# Patient Record
Sex: Female | Born: 1990 | Race: Black or African American | Hispanic: No | Marital: Single | Smoking: Never smoker
Health system: Southern US, Community
[De-identification: ages and names within clinical notes are randomized; demographics above are authoritative.]

## PROBLEM LIST (undated history)

## (undated) DIAGNOSIS — D649 Anemia, unspecified: Secondary | ICD-10-CM

---

## 2015-03-23 ENCOUNTER — Encounter (HOSPITAL_COMMUNITY)
Admission: EM | Disposition: A | Discharge status: Home / Self Care | Payer: Commercial Managed Care - PPO | Source: Home / Self Care | Attending: Neurosurgery

## 2015-03-23 ENCOUNTER — Inpatient Hospital Stay (HOSPITAL_COMMUNITY)
Admission: EM | Admit: 2015-03-23 | Discharge: 2015-03-27 | DRG: 456 | Disposition: A | Discharge status: Home / Self Care | Payer: Commercial Managed Care - PPO | Attending: Neurosurgery | Admitting: Neurosurgery

## 2015-03-23 ENCOUNTER — Encounter (HOSPITAL_COMMUNITY): Payer: Self-pay | Admitting: Emergency Medicine

## 2015-03-23 ENCOUNTER — Inpatient Hospital Stay (HOSPITAL_COMMUNITY): Payer: Commercial Managed Care - PPO | Admitting: Certified Registered"

## 2015-03-23 ENCOUNTER — Emergency Department (HOSPITAL_COMMUNITY): Payer: Commercial Managed Care - PPO

## 2015-03-23 ENCOUNTER — Inpatient Hospital Stay (HOSPITAL_COMMUNITY): Payer: Commercial Managed Care - PPO

## 2015-03-23 DIAGNOSIS — Y92410 Unspecified street and highway as the place of occurrence of the external cause: Secondary | ICD-10-CM

## 2015-03-23 DIAGNOSIS — S32001A Stable burst fracture of unspecified lumbar vertebra, initial encounter for closed fracture: Secondary | ICD-10-CM | POA: Diagnosis present

## 2015-03-23 DIAGNOSIS — Z419 Encounter for procedure for purposes other than remedying health state, unspecified: Secondary | ICD-10-CM

## 2015-03-23 DIAGNOSIS — S34101A Unspecified injury to L1 level of lumbar spinal cord, initial encounter: Secondary | ICD-10-CM | POA: Diagnosis present

## 2015-03-23 DIAGNOSIS — S32019A Unspecified fracture of first lumbar vertebra, initial encounter for closed fracture: Secondary | ICD-10-CM

## 2015-03-23 DIAGNOSIS — M549 Dorsalgia, unspecified: Secondary | ICD-10-CM | POA: Diagnosis present

## 2015-03-23 DIAGNOSIS — S32011A Stable burst fracture of first lumbar vertebra, initial encounter for closed fracture: Principal | ICD-10-CM | POA: Diagnosis present

## 2015-03-23 DIAGNOSIS — S32000A Wedge compression fracture of unspecified lumbar vertebra, initial encounter for closed fracture: Secondary | ICD-10-CM

## 2015-03-23 HISTORY — PX: LUMBAR LAMINECTOMY: SHX95

## 2015-03-23 HISTORY — DX: Anemia, unspecified: D64.9

## 2015-03-23 LAB — CBC WITH DIFFERENTIAL/PLATELET
Basophils Absolute: 0 10*3/uL (ref 0.0–0.1)
Basophils Relative: 0 %
EOS PCT: 0 %
Eosinophils Absolute: 0 10*3/uL (ref 0.0–0.7)
HEMATOCRIT: 37.2 % (ref 36.0–46.0)
Hemoglobin: 11.6 g/dL — ABNORMAL LOW (ref 12.0–15.0)
LYMPHS ABS: 1.8 10*3/uL (ref 0.7–4.0)
LYMPHS PCT: 27 %
MCH: 24.5 pg — AB (ref 26.0–34.0)
MCHC: 31.2 g/dL (ref 30.0–36.0)
MCV: 78.5 fL (ref 78.0–100.0)
Monocytes Absolute: 0.6 10*3/uL (ref 0.1–1.0)
Monocytes Relative: 9 %
Neutro Abs: 4.4 10*3/uL (ref 1.7–7.7)
Neutrophils Relative %: 64 %
PLATELETS: 381 10*3/uL (ref 150–400)
RBC: 4.74 MIL/uL (ref 3.87–5.11)
RDW: 14 % (ref 11.5–15.5)
WBC: 6.8 10*3/uL (ref 4.0–10.5)

## 2015-03-23 LAB — URINALYSIS, ROUTINE W REFLEX MICROSCOPIC
BILIRUBIN URINE: NEGATIVE
Glucose, UA: NEGATIVE mg/dL
Hgb urine dipstick: NEGATIVE
Ketones, ur: NEGATIVE mg/dL
LEUKOCYTES UA: NEGATIVE
NITRITE: NEGATIVE
PH: 7 (ref 5.0–8.0)
Protein, ur: NEGATIVE mg/dL
SPECIFIC GRAVITY, URINE: 1.01 (ref 1.005–1.030)

## 2015-03-23 LAB — TYPE AND SCREEN
ABO/RH(D): B POS
ANTIBODY SCREEN: NEGATIVE

## 2015-03-23 LAB — COMPREHENSIVE METABOLIC PANEL
ALBUMIN: 3.9 g/dL (ref 3.5–5.0)
ALT: 21 U/L (ref 14–54)
AST: 35 U/L (ref 15–41)
Alkaline Phosphatase: 66 U/L (ref 38–126)
Anion gap: 9 (ref 5–15)
BUN: 7 mg/dL (ref 6–20)
CHLORIDE: 105 mmol/L (ref 101–111)
CO2: 24 mmol/L (ref 22–32)
Calcium: 9.3 mg/dL (ref 8.9–10.3)
Creatinine, Ser: 0.84 mg/dL (ref 0.44–1.00)
GFR calc Af Amer: >60 mL/min (ref >60)
GLUCOSE: 142 mg/dL — AB (ref 65–99)
POTASSIUM: 3.6 mmol/L (ref 3.5–5.1)
Sodium: 138 mmol/L (ref 135–145)
Total Bilirubin: 0.5 mg/dL (ref 0.3–1.2)
Total Protein: 7.6 g/dL (ref 6.5–8.1)

## 2015-03-23 LAB — I-STAT BETA HCG BLOOD, ED (MC, WL, AP ONLY): I-stat hCG, quantitative: <5 m[IU]/mL (ref <5)

## 2015-03-23 LAB — ABO/RH: ABO/RH(D): B POS

## 2015-03-23 SURGERY — POSTERIOR LUMBAR FUSION 2 LEVEL
Anesthesia: General | Site: Back

## 2015-03-23 MED ORDER — OXYCODONE HCL 5 MG PO TABS
ORAL_TABLET | ORAL | Status: AC
  Filled 2015-03-23: qty 1

## 2015-03-23 MED ORDER — MORPHINE SULFATE (PF) 4 MG/ML IV SOLN
4.0000 mg | Freq: Once | INTRAVENOUS | Status: AC
  Administered 2015-03-23: 4 mg via INTRAVENOUS
  Filled 2015-03-23: qty 1

## 2015-03-23 MED ORDER — OXYCODONE HCL 5 MG PO TABS
5.0000 mg | ORAL_TABLET | Freq: Once | ORAL | Status: AC | PRN
  Administered 2015-03-23: 5 mg via ORAL

## 2015-03-23 MED ORDER — ARTIFICIAL TEARS OP OINT
TOPICAL_OINTMENT | OPHTHALMIC | Status: AC
  Filled 2015-03-23: qty 3.5

## 2015-03-23 MED ORDER — VANCOMYCIN HCL 1000 MG IV SOLR
INTRAVENOUS | Status: DC | PRN
Start: 2015-03-23 — End: 2015-03-23
  Administered 2015-03-23: 1000 mg

## 2015-03-23 MED ORDER — VANCOMYCIN HCL 1000 MG IV SOLR
INTRAVENOUS | Status: AC
Start: 2015-03-23 — End: 2015-03-24
  Filled 2015-03-23: qty 1000

## 2015-03-23 MED ORDER — ONDANSETRON HCL 4 MG/2ML IJ SOLN
INTRAMUSCULAR | Status: DC | PRN
  Administered 2015-03-23: 4 mg via INTRAVENOUS

## 2015-03-23 MED ORDER — MIDAZOLAM HCL 2 MG/2ML IJ SOLN
INTRAMUSCULAR | Status: AC
  Filled 2015-03-23: qty 2

## 2015-03-23 MED ORDER — PROMETHAZINE HCL 25 MG/ML IJ SOLN: 6.2500 mg | INTRAMUSCULAR | Status: DC | PRN

## 2015-03-23 MED ORDER — LIDOCAINE HCL (CARDIAC) 20 MG/ML IV SOLN
INTRAVENOUS | Status: AC
  Filled 2015-03-23: qty 5

## 2015-03-23 MED ORDER — 0.9 % SODIUM CHLORIDE (POUR BTL) OPTIME
TOPICAL | Status: DC | PRN
  Administered 2015-03-23: 1000 mL

## 2015-03-23 MED ORDER — ROCURONIUM BROMIDE 100 MG/10ML IV SOLN
INTRAVENOUS | Status: DC | PRN
  Administered 2015-03-23: 10 mg via INTRAVENOUS
  Administered 2015-03-23: 50 mg via INTRAVENOUS
  Administered 2015-03-23 (×2): 10 mg via INTRAVENOUS

## 2015-03-23 MED ORDER — BUPIVACAINE HCL (PF) 0.25 % IJ SOLN
INTRAMUSCULAR | Status: DC | PRN
  Administered 2015-03-23: 30 mL

## 2015-03-23 MED ORDER — SODIUM CHLORIDE 0.9 % IR SOLN
Status: DC | PRN
  Administered 2015-03-23: 500 mL

## 2015-03-23 MED ORDER — PROPOFOL 10 MG/ML IV BOLUS
INTRAVENOUS | Status: AC
  Filled 2015-03-23: qty 20

## 2015-03-23 MED ORDER — FENTANYL CITRATE (PF) 250 MCG/5ML IJ SOLN
INTRAMUSCULAR | Status: AC
Start: 2015-03-23 — End: 2015-03-23
  Filled 2015-03-23: qty 5

## 2015-03-23 MED ORDER — ROCURONIUM BROMIDE 50 MG/5ML IV SOLN
INTRAVENOUS | Status: AC
  Filled 2015-03-23: qty 1

## 2015-03-23 MED ORDER — NEOSTIGMINE METHYLSULFATE 10 MG/10ML IV SOLN
INTRAVENOUS | Status: AC
  Filled 2015-03-23: qty 1

## 2015-03-23 MED ORDER — NEOSTIGMINE METHYLSULFATE 10 MG/10ML IV SOLN
INTRAVENOUS | Status: DC | PRN
  Administered 2015-03-23: 4 mg via INTRAVENOUS

## 2015-03-23 MED ORDER — PROPOFOL 10 MG/ML IV BOLUS
INTRAVENOUS | Status: DC | PRN
  Administered 2015-03-23: 200 mg via INTRAVENOUS

## 2015-03-23 MED ORDER — LACTATED RINGERS IV SOLN
INTRAVENOUS | Status: DC | PRN
  Administered 2015-03-23 (×2): via INTRAVENOUS

## 2015-03-23 MED ORDER — HYDROMORPHONE HCL 1 MG/ML IJ SOLN
INTRAMUSCULAR | Status: AC
  Filled 2015-03-23: qty 1

## 2015-03-23 MED ORDER — HYDROMORPHONE HCL 1 MG/ML IJ SOLN
1.0000 mg | INTRAMUSCULAR | Status: DC | PRN
  Administered 2015-03-23: 2 mg via INTRAVENOUS

## 2015-03-23 MED ORDER — ONDANSETRON HCL 4 MG/2ML IJ SOLN
4.0000 mg | Freq: Once | INTRAMUSCULAR | Status: AC
  Administered 2015-03-23: 4 mg via INTRAVENOUS
  Filled 2015-03-23: qty 2

## 2015-03-23 MED ORDER — ONDANSETRON HCL 4 MG/2ML IJ SOLN
INTRAMUSCULAR | Status: AC
  Filled 2015-03-23: qty 2

## 2015-03-23 MED ORDER — IOHEXOL 300 MG/ML  SOLN
100.0000 mL | Freq: Once | INTRAMUSCULAR | Status: AC | PRN
  Administered 2015-03-23: 100 mL via INTRAVENOUS

## 2015-03-23 MED ORDER — GLYCOPYRROLATE 0.2 MG/ML IJ SOLN
INTRAMUSCULAR | Status: DC | PRN
  Administered 2015-03-23: 0.6 mg via INTRAVENOUS

## 2015-03-23 MED ORDER — GLYCOPYRROLATE 0.2 MG/ML IJ SOLN
INTRAMUSCULAR | Status: AC
  Filled 2015-03-23: qty 3

## 2015-03-23 MED ORDER — FENTANYL CITRATE (PF) 250 MCG/5ML IJ SOLN
INTRAMUSCULAR | Status: AC
  Filled 2015-03-23: qty 5

## 2015-03-23 MED ORDER — FENTANYL CITRATE (PF) 250 MCG/5ML IJ SOLN
INTRAMUSCULAR | Status: DC | PRN
  Administered 2015-03-23 (×4): 50 ug via INTRAVENOUS
  Administered 2015-03-23: 150 ug via INTRAVENOUS
  Administered 2015-03-23 (×3): 50 ug via INTRAVENOUS

## 2015-03-23 MED ORDER — FENTANYL CITRATE (PF) 100 MCG/2ML IJ SOLN
100.0000 ug | Freq: Once | INTRAMUSCULAR | Status: AC
  Administered 2015-03-23: 100 ug via INTRAVENOUS
  Filled 2015-03-23: qty 2

## 2015-03-23 MED ORDER — OXYCODONE HCL 5 MG/5ML PO SOLN: 5.0000 mg | Freq: Once | ORAL | Status: AC | PRN

## 2015-03-23 MED ORDER — MIDAZOLAM HCL 5 MG/5ML IJ SOLN
INTRAMUSCULAR | Status: DC | PRN
Start: 2015-03-23 — End: 2015-03-23
  Administered 2015-03-23: 2 mg via INTRAVENOUS

## 2015-03-23 MED ORDER — FENTANYL CITRATE (PF) 100 MCG/2ML IJ SOLN
50.0000 ug | Freq: Once | INTRAMUSCULAR | Status: AC
  Administered 2015-03-23: 50 ug via INTRAVENOUS
  Filled 2015-03-23: qty 2

## 2015-03-23 MED ORDER — HYDROMORPHONE HCL 1 MG/ML IJ SOLN
1.0000 mg | INTRAMUSCULAR | Status: DC | PRN
  Filled 2015-03-23: qty 2

## 2015-03-23 MED ORDER — HYDROMORPHONE HCL 1 MG/ML IJ SOLN
0.2500 mg | INTRAMUSCULAR | Status: DC | PRN
  Administered 2015-03-23 – 2015-03-24 (×2): 0.5 mg via INTRAVENOUS

## 2015-03-23 MED ORDER — LIDOCAINE HCL (CARDIAC) 20 MG/ML IV SOLN
INTRAVENOUS | Status: DC | PRN
  Administered 2015-03-23: 60 mg via INTRAVENOUS

## 2015-03-23 MED ORDER — CEFAZOLIN SODIUM-DEXTROSE 2-3 GM-% IV SOLR
2.0000 g | INTRAVENOUS | Status: AC
  Administered 2015-03-23: 2 g via INTRAVENOUS
  Filled 2015-03-23: qty 50

## 2015-03-23 MED ORDER — SURGIFOAM 100 EX MISC
CUTANEOUS | Status: DC | PRN
  Administered 2015-03-23: 20 mL via TOPICAL

## 2015-03-23 MED ORDER — CEFAZOLIN SODIUM-DEXTROSE 2-3 GM-% IV SOLR
INTRAVENOUS | Status: AC
  Filled 2015-03-23: qty 50

## 2015-03-23 SURGICAL SUPPLY — 64 items
BAG DECANTER FOR FLEXI CONT (MISCELLANEOUS) ×3 IMPLANT
BENZOIN TINCTURE PRP APPL 2/3 (GAUZE/BANDAGES/DRESSINGS) ×3 IMPLANT
BLADE CLIPPER SURG (BLADE) IMPLANT
BRUSH SCRUB EZ PLAIN DRY (MISCELLANEOUS) ×3 IMPLANT
BUR CUTTER 7.0 ROUND (BURR) ×3 IMPLANT
BUR MATCHSTICK NEURO 3.0 LAGG (BURR) ×3 IMPLANT
CANISTER SUCT 3000ML PPV (MISCELLANEOUS) ×3 IMPLANT
CAP LCK SPNE (Orthopedic Implant) ×10 IMPLANT
CAP LOCK SPINE RADIUS (Orthopedic Implant) ×10 IMPLANT
CAP LOCKING (Orthopedic Implant) ×20 IMPLANT
CLOSURE WOUND 1/2 X4 (GAUZE/BANDAGES/DRESSINGS) ×2
CONT SPEC 4OZ CLIKSEAL STRL BL (MISCELLANEOUS) ×3 IMPLANT
COVER BACK TABLE 60X90IN (DRAPES) ×3 IMPLANT
CROSSLINK MEDIUM (Orthopedic Implant) ×3 IMPLANT
DECANTER SPIKE VIAL GLASS SM (MISCELLANEOUS) ×3 IMPLANT
DRAPE C-ARM 42X72 X-RAY (DRAPES) ×12 IMPLANT
DRAPE LAPAROTOMY 100X72X124 (DRAPES) ×3 IMPLANT
DRAPE POUCH INSTRU U-SHP 10X18 (DRAPES) ×3 IMPLANT
DRAPE PROXIMA HALF (DRAPES) IMPLANT
DRAPE SURG 17X23 STRL (DRAPES) ×12 IMPLANT
DRSG OPSITE POSTOP 4X10 (GAUZE/BANDAGES/DRESSINGS) ×3 IMPLANT
DURAPREP 26ML APPLICATOR (WOUND CARE) ×3 IMPLANT
ELECT REM PT RETURN 9FT ADLT (ELECTROSURGICAL) ×3
ELECTRODE REM PT RTRN 9FT ADLT (ELECTROSURGICAL) ×1 IMPLANT
EVACUATOR 1/8 PVC DRAIN (DRAIN) ×3 IMPLANT
GAUZE SPONGE 4X4 12PLY STRL (GAUZE/BANDAGES/DRESSINGS) ×3 IMPLANT
GAUZE SPONGE 4X4 16PLY XRAY LF (GAUZE/BANDAGES/DRESSINGS) IMPLANT
GLOVE BIO SURGEON STRL SZ 6.5 (GLOVE) ×4 IMPLANT
GLOVE BIO SURGEON STRL SZ7 (GLOVE) ×6 IMPLANT
GLOVE BIO SURGEONS STRL SZ 6.5 (GLOVE) ×2
GLOVE ECLIPSE 9.0 STRL (GLOVE) ×6 IMPLANT
GLOVE EXAM NITRILE LRG STRL (GLOVE) IMPLANT
GLOVE EXAM NITRILE MD LF STRL (GLOVE) IMPLANT
GLOVE EXAM NITRILE XL STR (GLOVE) ×6 IMPLANT
GLOVE EXAM NITRILE XS STR PU (GLOVE) IMPLANT
GLOVE INDICATOR 6.5 STRL GRN (GLOVE) ×3 IMPLANT
GOWN STRL REUS W/ TWL LRG LVL3 (GOWN DISPOSABLE) IMPLANT
GOWN STRL REUS W/ TWL XL LVL3 (GOWN DISPOSABLE) ×2 IMPLANT
GOWN STRL REUS W/TWL 2XL LVL3 (GOWN DISPOSABLE) IMPLANT
GOWN STRL REUS W/TWL LRG LVL3 (GOWN DISPOSABLE)
GOWN STRL REUS W/TWL XL LVL3 (GOWN DISPOSABLE) ×4
KIT BASIN OR (CUSTOM PROCEDURE TRAY) ×3 IMPLANT
KIT ROOM TURNOVER OR (KITS) ×3 IMPLANT
LIQUID BAND (GAUZE/BANDAGES/DRESSINGS) ×6 IMPLANT
NEEDLE HYPO 22GX1.5 SAFETY (NEEDLE) ×3 IMPLANT
NS IRRIG 1000ML POUR BTL (IV SOLUTION) ×3 IMPLANT
PACK LAMINECTOMY NEURO (CUSTOM PROCEDURE TRAY) ×3 IMPLANT
ROD HEX 180MM (Rod) ×6 IMPLANT
SCREW 5.75X40M (Screw) ×18 IMPLANT
SCREW 5.75X45MM (Screw) ×12 IMPLANT
SPONGE SURGIFOAM ABS GEL 100 (HEMOSTASIS) ×3 IMPLANT
STRIP BIOACTIVE VITOSS 25X100X (Neuro Prosthesis/Implant) ×6 IMPLANT
STRIP CLOSURE SKIN 1/2X4 (GAUZE/BANDAGES/DRESSINGS) ×4 IMPLANT
STRIP SURGICAL 1/2 X 6 IN (GAUZE/BANDAGES/DRESSINGS) ×3 IMPLANT
SUT VIC AB 0 CT1 18XCR BRD8 (SUTURE) ×1 IMPLANT
SUT VIC AB 0 CT1 8-18 (SUTURE) ×2
SUT VIC AB 1 CT1 18XBRD ANBCTR (SUTURE) ×2 IMPLANT
SUT VIC AB 1 CT1 8-18 (SUTURE) ×4
SUT VIC AB 2-0 CT1 18 (SUTURE) ×9 IMPLANT
SUT VIC AB 3-0 SH 8-18 (SUTURE) ×6 IMPLANT
TOWEL OR 17X24 6PK STRL BLUE (TOWEL DISPOSABLE) ×3 IMPLANT
TOWEL OR 17X26 10 PK STRL BLUE (TOWEL DISPOSABLE) ×3 IMPLANT
TRAY FOLEY W/METER SILVER 14FR (SET/KITS/TRAYS/PACK) IMPLANT
WATER STERILE IRR 1000ML POUR (IV SOLUTION) ×3 IMPLANT
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
## 2015-03-23 NOTE — Transfer of Care (Signed)
Immediate Anesthesia Transfer of Care Note  Patient: Tabitha Perry  Procedure(s) Performed: Procedure(s): Thoracic ten - Lumbar three  Posterior Fusion, Lumbar one Laminectomy (N/A)  Patient Location: PACU  Anesthesia Type:General  Level of Consciousness: awake, sedated and patient cooperative  Airway & Oxygen Therapy: Patient connected to nasal cannula oxygen  Post-op Assessment: Report given to RN, Post -op Vital signs reviewed and stable and Patient moving all extremities X 4  Post vital signs: Reviewed and stable  Last Vitals:  Filed Vitals:   03/23/15 1615 03/23/15 1645  BP: 109/53 116/58  Pulse: 84 86  Resp: 16     Complications: No apparent anesthesia complications

## 2015-03-23 NOTE — ED Notes (Signed)
MD at bedside. 
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
## 2015-03-23 NOTE — ED Notes (Signed)
Brandy from xray called stating patient was unable to lie still for xray due to pain, Dr. Clarene Duke made aware and advised patient can get of fentanyl.

## 2015-03-23 NOTE — ED Notes (Signed)
Assisted Brooke, RN with placing foley cath in patient; urine collected and sent to lab for testing; visitors at bedside

## 2015-03-23 NOTE — ED Notes (Signed)
Ortho contacted for TLSO brace, advised this RN they would order it for patient.

## 2015-03-23 NOTE — ED Notes (Signed)
Contacted Dr. Jordan Likes about pt requesting pain meds - will return call

## 2015-03-23 NOTE — ED Notes (Signed)
Myself and Chelsea, RN assisted Brooke, RN with placing the patient on a bedpan to urinate; patient was unsuccessful; visitors at bedside

## 2015-03-23 NOTE — Anesthesia Procedure Notes (Signed)
Procedure Name: Intubation Date/Time: 03/23/2015 8:05 PM Performed by: Jerilee Hoh Pre-anesthesia Checklist: Patient identified, Emergency Drugs available, Patient being monitored and Suction available Patient Re-evaluated:Patient Re-evaluated prior to inductionOxygen Delivery Method: Circle system utilized Preoxygenation: Pre-oxygenation with 100% oxygen Intubation Type: IV induction Ventilation: Mask ventilation without difficulty Laryngoscope Size: Mac and 3 Grade View: Grade I Tube type: Oral Tube size: 7.0 mm Number of attempts: 1 Airway Equipment and Method: Stylet Placement Confirmation: ETT inserted through vocal cords under direct vision,  positive ETCO2 and breath sounds checked- equal and bilateral Secured at: 21 cm Tube secured with: Tape Dental Injury: Teeth and Oropharynx as per pre-operative assessment

## 2015-03-23 NOTE — ED Notes (Signed)
Neuro at bedside.

## 2015-03-23 NOTE — Progress Notes (Signed)
Orthopedic Tech Progress Note Patient Details:  Tabitha Perry 01/19/1991 [id number]  Patient ID: Tabitha Perry, female   DOB: 1990-03-11, 25 y.o.   MRN: [id number]   Tabitha Perry 03/23/2015, 2:16 PMCalled [employer] for TLSO.

## 2015-03-24 ENCOUNTER — Encounter (HOSPITAL_COMMUNITY): Payer: Self-pay | Admitting: General Practice

## 2015-03-24 DIAGNOSIS — S32001A Stable burst fracture of unspecified lumbar vertebra, initial encounter for closed fracture: Secondary | ICD-10-CM | POA: Diagnosis present

## 2015-03-24 MED ORDER — PHENOL 1.4 % MT LIQD: 1.0000 | OROMUCOSAL | Status: DC | PRN

## 2015-03-24 MED ORDER — SENNA 8.6 MG PO TABS
1.0000 | ORAL_TABLET | Freq: Two times a day (BID) | ORAL | Status: DC
  Administered 2015-03-24 – 2015-03-27 (×8): 8.6 mg via ORAL
  Filled 2015-03-24 (×8): qty 1

## 2015-03-24 MED ORDER — DIAZEPAM 5 MG PO TABS: 5.0000 mg | ORAL_TABLET | Freq: Four times a day (QID) | ORAL | Status: DC | PRN

## 2015-03-24 MED ORDER — OXYCODONE-ACETAMINOPHEN 5-325 MG PO TABS
1.0000 | ORAL_TABLET | ORAL | Status: DC | PRN
  Administered 2015-03-24: 2 via ORAL
  Filled 2015-03-24: qty 2

## 2015-03-24 MED ORDER — BISACODYL 10 MG RE SUPP: 10.0000 mg | Freq: Every day | RECTAL | Status: DC | PRN

## 2015-03-24 MED ORDER — SODIUM CHLORIDE 0.9 % IV SOLN: 250.0000 mL | INTRAVENOUS | Status: DC

## 2015-03-24 MED ORDER — CEFAZOLIN SODIUM 1-5 GM-% IV SOLN
1.0000 g | Freq: Three times a day (TID) | INTRAVENOUS | Status: AC
Start: 2015-03-24 — End: 2015-03-24
  Administered 2015-03-24 (×2): 1 g via INTRAVENOUS
  Filled 2015-03-24 (×2): qty 50

## 2015-03-24 MED ORDER — FLEET ENEMA 7-19 GM/118ML RE ENEM: 1.0000 | ENEMA | Freq: Once | RECTAL | Status: DC | PRN

## 2015-03-24 MED ORDER — MENTHOL 3 MG MT LOZG: 1.0000 | LOZENGE | OROMUCOSAL | Status: DC | PRN

## 2015-03-24 MED ORDER — HYDROCODONE-ACETAMINOPHEN 5-325 MG PO TABS
1.0000 | ORAL_TABLET | ORAL | Status: DC | PRN
  Administered 2015-03-24: 2 via ORAL
  Administered 2015-03-26: 1 via ORAL
  Administered 2015-03-27: 2 via ORAL
  Filled 2015-03-24 (×3): qty 2

## 2015-03-24 MED ORDER — SODIUM CHLORIDE 0.9% FLUSH
3.0000 mL | INTRAVENOUS | Status: DC | PRN
  Administered 2015-03-27: 3 mL via INTRAVENOUS
  Filled 2015-03-24: qty 3

## 2015-03-24 MED ORDER — ACETAMINOPHEN 650 MG RE SUPP: 650.0000 mg | RECTAL | Status: DC | PRN

## 2015-03-24 MED ORDER — HYDROMORPHONE HCL 1 MG/ML IJ SOLN
1.0000 mg | INTRAMUSCULAR | Status: DC | PRN
  Administered 2015-03-24 (×3): 1 mg via INTRAVENOUS
  Administered 2015-03-24 – 2015-03-25 (×7): 2 mg via INTRAVENOUS
  Administered 2015-03-25: 1 mg via INTRAVENOUS
  Administered 2015-03-26 – 2015-03-27 (×8): 2 mg via INTRAVENOUS
  Filled 2015-03-24: qty 2
  Filled 2015-03-24: qty 1
  Filled 2015-03-24: qty 2
  Filled 2015-03-24: qty 1
  Filled 2015-03-24 (×12): qty 2
  Filled 2015-03-24: qty 1
  Filled 2015-03-24 (×4): qty 2

## 2015-03-24 MED ORDER — SODIUM CHLORIDE 0.9% FLUSH
3.0000 mL | Freq: Two times a day (BID) | INTRAVENOUS | Status: DC
  Administered 2015-03-24 – 2015-03-27 (×7): 3 mL via INTRAVENOUS

## 2015-03-24 MED ORDER — ONDANSETRON HCL 4 MG/2ML IJ SOLN
4.0000 mg | INTRAMUSCULAR | Status: DC | PRN
Start: 2015-03-24 — End: 2015-03-27
  Administered 2015-03-24 (×2): 4 mg via INTRAVENOUS
  Filled 2015-03-24 (×3): qty 2

## 2015-03-24 MED ORDER — ACETAMINOPHEN 325 MG PO TABS
650.0000 mg | ORAL_TABLET | ORAL | Status: DC | PRN
Start: 2015-03-24 — End: 2015-03-27

## 2015-03-24 MED ORDER — POLYETHYLENE GLYCOL 3350 17 G PO PACK
17.0000 g | PACK | Freq: Every day | ORAL | Status: DC | PRN
  Administered 2015-03-27: 17 g via ORAL
  Filled 2015-03-24: qty 1

## 2015-03-24 MED FILL — Heparin Sodium (Porcine) Inj 1000 Unit/ML: INTRAMUSCULAR | Qty: 30 | Status: AC

## 2015-03-24 MED FILL — Sodium Chloride IV Soln 0.9%: INTRAVENOUS | Qty: 1000 | Status: AC

## 2015-03-24 NOTE — Anesthesia Postprocedure Evaluation (Signed)
Anesthesia Post Note  Patient: Tabitha Perry  Procedure(s) Performed: Procedure(s) (LRB): Thoracic ten - Lumbar three  Posterior Fusion, Lumbar one Laminectomy (N/A)  Patient location during evaluation: PACU Anesthesia Type: General Level of consciousness: awake and alert Pain management: pain level controlled Vital Signs Assessment: post-procedure vital signs reviewed and stable Respiratory status: spontaneous breathing Cardiovascular status: blood pressure returned to baseline Anesthetic complications: no    Last Vitals:  Filed Vitals:   03/24/15 0015 03/24/15 0052  BP: 121/67 121/70  Pulse: 79 98  Temp:  36.8 C  Resp: 10 18    Last Pain:  Filed Vitals:   03/24/15 0052  PainSc: 7                  Kennieth Rad

## 2015-03-24 NOTE — Progress Notes (Signed)
Patient arrived on the unit from PACU.  Pt was sleeping but easily awakened and vital signs stable.  Bed was plugged back into the wall, and alarm set.
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
## 2015-03-24 NOTE — Progress Notes (Signed)
Pt's foley removed. Pt up to restroom post removal of foley and voided x1. Will continue to monitor pt.

## 2015-03-24 NOTE — Progress Notes (Signed)
Postop 1  Doing well.  Pain well controlled. No radicular symptoms.  Legs feel normal.  VSSAF. AA ox4 fluent. Motor and sens intact. Wd cdi  Doing well -- mobilize today
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
## 2015-03-25 MED ORDER — WHITE PETROLATUM GEL
Status: AC
  Filled 2015-03-25: qty 1
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
## 2015-03-25 NOTE — Progress Notes (Signed)
Postop day 2. Overall patient doing very well. Back pain well controlled. No lower extremity symptoms. Up ambulating with minimal difficulty. No flatus however.  Afebrile. Vitals are stable. Awake and alert. Oriented and appropriate. Motor and sensory function intact. Wound clean and dry. Abdomen soft but mildly distended.  Progressing well after thoracic lumbar decompression and fusion. Continued efforts at mobilization. Possible discharge home tomorrow.
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
## 2015-03-26 NOTE — Progress Notes (Signed)
[facility], [facility], [facility] all unable to accept patient. Patient will be followed by [facility] PT
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
## 2015-03-27 MED ORDER — DIAZEPAM 5 MG PO TABS: 5.0000 mg | ORAL_TABLET | Freq: Four times a day (QID) | ORAL | Status: AC | PRN

## 2015-03-27 MED ORDER — SENNA 8.6 MG PO TABS: 1.0000 | ORAL_TABLET | Freq: Two times a day (BID) | ORAL | Status: AC

## 2015-03-27 MED ORDER — BISACODYL 10 MG RE SUPP: 10.0000 mg | Freq: Every day | RECTAL | Status: AC | PRN

## 2015-03-27 MED ORDER — METHOCARBAMOL 750 MG PO TABS: 750.0000 mg | ORAL_TABLET | Freq: Four times a day (QID) | ORAL | Status: AC | PRN

## 2015-03-27 MED ORDER — OXYCODONE-ACETAMINOPHEN 5-325 MG PO TABS: 1.0000 | ORAL_TABLET | ORAL | Status: AC | PRN

## 2015-03-27 NOTE — Progress Notes (Signed)
No acute events.  Now passing gas.  No bowel movement yet. AVSS Full strength BLE Incision c/d/i Stable Ready for d/c
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
## 2016-12-05 ENCOUNTER — Encounter: Payer: Self-pay | Admitting: Emergency Medicine

## 2016-12-05 DIAGNOSIS — Z79899 Other long term (current) drug therapy: Secondary | ICD-10-CM | POA: Insufficient documentation

## 2016-12-05 DIAGNOSIS — N72 Inflammatory disease of cervix uteri: Secondary | ICD-10-CM | POA: Insufficient documentation

## 2016-12-05 LAB — COMPREHENSIVE METABOLIC PANEL
ALT: 17 U/L (ref 14–54)
ANION GAP: 7 (ref 5–15)
AST: 19 U/L (ref 15–41)
Albumin: 3.8 g/dL (ref 3.5–5.0)
Alkaline Phosphatase: 76 U/L (ref 38–126)
BILIRUBIN TOTAL: 0.5 mg/dL (ref 0.3–1.2)
BUN: 7 mg/dL (ref 6–20)
CHLORIDE: 102 mmol/L (ref 101–111)
CO2: 28 mmol/L (ref 22–32)
Calcium: 9.4 mg/dL (ref 8.9–10.3)
Creatinine, Ser: 0.87 mg/dL (ref 0.44–1.00)
GFR calc Af Amer: >60 mL/min (ref >60)
Glucose, Bld: 111 mg/dL — ABNORMAL HIGH (ref 65–99)
POTASSIUM: 3.6 mmol/L (ref 3.5–5.1)
Sodium: 137 mmol/L (ref 135–145)
TOTAL PROTEIN: 8.2 g/dL — AB (ref 6.5–8.1)

## 2016-12-05 LAB — CBC
HEMATOCRIT: 32.9 % — AB (ref 35.0–47.0)
HEMOGLOBIN: 10.9 g/dL — AB (ref 12.0–16.0)
MCH: 25.5 pg — ABNORMAL LOW (ref 26.0–34.0)
MCHC: 33.2 g/dL (ref 32.0–36.0)
MCV: 77 fL — AB (ref 80.0–100.0)
Platelets: 373 10*3/uL (ref 150–440)
RBC: 4.27 MIL/uL (ref 3.80–5.20)
RDW: 14.5 % (ref 11.5–14.5)
WBC: 9.1 10*3/uL (ref 3.6–11.0)

## 2016-12-05 LAB — URINALYSIS, COMPLETE (UACMP) WITH MICROSCOPIC
BACTERIA UA: NONE SEEN
Bilirubin Urine: NEGATIVE
Glucose, UA: NEGATIVE mg/dL
HGB URINE DIPSTICK: NEGATIVE
Ketones, ur: 5 mg/dL — AB
NITRITE: NEGATIVE
PROTEIN: NEGATIVE mg/dL
SPECIFIC GRAVITY, URINE: 1.026 (ref 1.005–1.030)
pH: 5 (ref 5.0–8.0)

## 2016-12-05 LAB — LIPASE, BLOOD: Lipase: 22 U/L (ref 11–51)

## 2016-12-05 LAB — POCT PREGNANCY, URINE: PREG TEST UR: NEGATIVE

## 2016-12-05 NOTE — ED Triage Notes (Signed)
Patient with complaint of pelvic pain that started on Sunday. Patient states that she also has pain with urination. Patient states that she was seen by PCP and started on Amoxicillin today for UTI and sinus infection. Patient reports that the pain has become worse throughout the day. Patient reports that she attempted to have intercourse but that the pain was to bad.

## 2016-12-06 ENCOUNTER — Emergency Department
Admission: EM | Admit: 2016-12-06 | Discharge: 2016-12-06 | Disposition: A | Discharge status: Home / Self Care | Payer: Self-pay | Attending: Emergency Medicine | Admitting: Emergency Medicine

## 2016-12-06 ENCOUNTER — Emergency Department: Payer: Self-pay

## 2016-12-06 DIAGNOSIS — N72 Inflammatory disease of cervix uteri: Secondary | ICD-10-CM

## 2016-12-06 DIAGNOSIS — R109 Unspecified abdominal pain: Secondary | ICD-10-CM

## 2016-12-06 DIAGNOSIS — R102 Pelvic and perineal pain: Secondary | ICD-10-CM

## 2016-12-06 LAB — CHLAMYDIA/NGC RT PCR (ARMC ONLY)
CHLAMYDIA TR: NOT DETECTED
N gonorrhoeae: NOT DETECTED

## 2016-12-06 LAB — WET PREP, GENITAL
CLUE CELLS WET PREP: NONE SEEN
Sperm: NONE SEEN
TRICH WET PREP: NONE SEEN
YEAST WET PREP: NONE SEEN

## 2016-12-06 MED ORDER — DOXYCYCLINE HYCLATE 100 MG PO TABS
100.0000 mg | ORAL_TABLET | Freq: Once | ORAL | Status: AC
  Administered 2016-12-06: 100 mg via ORAL
  Filled 2016-12-06: qty 1

## 2016-12-06 MED ORDER — CEFTRIAXONE SODIUM 250 MG IJ SOLR
250.0000 mg | Freq: Once | INTRAMUSCULAR | Status: AC
  Administered 2016-12-06: 250 mg via INTRAMUSCULAR
  Filled 2016-12-06: qty 250

## 2016-12-06 MED ORDER — KETOROLAC TROMETHAMINE 60 MG/2ML IM SOLN
60.0000 mg | Freq: Once | INTRAMUSCULAR | Status: AC
  Administered 2016-12-06: 60 mg via INTRAMUSCULAR
  Filled 2016-12-06: qty 2

## 2016-12-06 MED ORDER — DOXYCYCLINE HYCLATE 100 MG PO TABS: 100.0000 mg | ORAL_TABLET | Freq: Two times a day (BID) | ORAL | 0 refills | Status: AC

## 2016-12-06 MED ORDER — CEPHALEXIN 500 MG PO CAPS: 500.0000 mg | ORAL_CAPSULE | Freq: Three times a day (TID) | ORAL | 0 refills | Status: AC

## 2016-12-06 NOTE — ED Provider Notes (Signed)
[facility] Emergency Department Provider Note   ____________________________________________   First MD Initiated Contact with Patient 12/06/16 0012     (approximate)  I have reviewed the triage vital signs and the nursing notes.   HISTORY  Chief Complaint Abdominal Pain and Urinary Tract Infection    HPI Nickole Adamek is a 26 y.o. female Who comes into the hospital today with some pelvic pain. The patient reports that the pain is so bad that it hurts to walk. She is also having some pain with urination from her stomach down. She states that the symptoms started 2 days ago and has gotten worse. She hasn't taken anything for pain. The patient rates her pain a 10 out of 10 in intensity. She states that she's had some vaginal discharge and she is sexually active and doesn't use protection. The patient was diagnosed with a urinary tract infection this morning and prescribed Augmentin but she reports that the pain got so bad she couldn't get off the toilet this evening. The patient decided to come into the hospital today for further evaluation of her symptoms. She is here for evaluation   Past Medical History:  Diagnosis Date  . Anemia     Patient Active Problem List   Diagnosis Date Noted  . Lumbar burst fracture (HCC) 03/24/2015  . L1 vertebral fracture (HCC) 03/23/2015    Past Surgical History:  Procedure Laterality Date  . LUMBAR LAMINECTOMY  03/23/2015   THORAIC 10      LUMBAR 3    Prior to Admission medications   Medication Sig Start Date End Date Taking? Authorizing Provider  bisacodyl (DULCOLAX) 10 MG suppository Place 1 suppository (10 mg total) rectally daily as needed for moderate constipation. 03/27/15   Ditty, Loura Halt, MD  cephALEXin (KEFLEX) 500 MG capsule Take 1 capsule (500 mg total) by mouth 3 (three) times daily. 12/06/16 12/16/16  Rebecka Apley, MD  diazepam (VALIUM) 5 MG tablet Take 1 tablet (5 mg total) by mouth every 6  (six) hours as needed (severe muscle spasms). 03/27/15   Ditty, Loura Halt, MD  doxycycline (VIBRA-TABS) 100 MG tablet Take 1 tablet (100 mg total) by mouth 2 (two) times daily. 12/06/16   Rebecka Apley, MD  methocarbamol (ROBAXIN-750) 750 MG tablet Take 1 tablet (750 mg total) by mouth every 6 (six) hours as needed for muscle spasms. 03/27/15   Ditty, Loura Halt, MD  oxyCODONE-acetaminophen (PERCOCET/ROXICET) 5-325 MG tablet Take 1-2 tablets by mouth every 4 (four) hours as needed for moderate pain. 03/27/15   Ditty, Loura Halt, MD  senna (SENOKOT) 8.6 MG TABS tablet Take 1 tablet (8.6 mg total) by mouth 2 (two) times daily. 03/27/15   Ditty, Loura Halt, MD    Allergies Patient has no known allergies.  No family history on file.  Social History Social History  Substance Use Topics  . Smoking status: Never Smoker  . Smokeless tobacco: Never Used  . Alcohol use No    Review of Systems  Constitutional: No fever/chills Eyes: No visual changes. ENT: No sore throat. Cardiovascular: Denies chest pain. Respiratory: Denies shortness of breath. Gastrointestinal:  abdominal pain.  No nausea, no vomiting.  No diarrhea.  No constipation. Genitourinary: pelvic pain and dysuria. Musculoskeletal: Negative for back pain. Skin: Negative for rash. Neurological: Negative for headaches, focal weakness or numbness.   ____________________________________________   PHYSICAL EXAM:  VITAL SIGNS: ED Triage Vitals [12/05/16 2243]  Enc Vitals Group     BP 129/78  Pulse Rate 82     Resp 18     Temp 98.6 F (37 C)     Temp Source Oral     SpO2 99 %     Weight 202 lb (91.6 kg)     Height  (1.626 m)     Head Circumference      Peak Flow      Pain Score 10     Pain Loc      Pain Edu?      Excl. in GC?     Constitutional: Alert and oriented. Well appearing and in mild distress. Eyes: Conjunctivae are normal. PERRL. EOMI. Head: Atraumatic. Nose: No  congestion/rhinnorhea. Mouth/Throat: Mucous membranes are moist.  Oropharynx non-erythematous. Cardiovascular: Normal rate, regular rhythm. Grossly normal heart sounds.  Good peripheral circulation. Respiratory: Normal respiratory effort.  No retractions. Lungs CTAB. Gastrointestinal: Soft with some lower abdominal tenderness to palpation. No distention. positive bowel sounds Genitourinary: normal external genitalia, thick white discharge, cervical motion tenderness with some mild uterine and adnexal tenderness to palpation Musculoskeletal: No lower extremity tenderness nor edema.   Neurologic:  Normal speech and language.  Skin:  Skin is warm, dry and intact.  Psychiatric: Mood and affect are normal.   ____________________________________________   LABS (all labs ordered are listed, but only abnormal results are displayed)  Labs Reviewed  WET PREP, GENITAL - Abnormal; Notable for the following:       Result Value   WBC, Wet Prep HPF POC MANY (*)    All other components within normal limits  COMPREHENSIVE METABOLIC PANEL - Abnormal; Notable for the following:    Glucose, Bld 111 (*)    Total Protein 8.2 (*)    All other components within normal limits  CBC - Abnormal; Notable for the following:    Hemoglobin 10.9 (*)    HCT 32.9 (*)    MCV 77.0 (*)    MCH 25.5 (*)    All other components within normal limits  URINALYSIS, COMPLETE (UACMP) WITH MICROSCOPIC - Abnormal; Notable for the following:    Color, Urine YELLOW (*)    APPearance HAZY (*)    Ketones, ur 5 (*)    Leukocytes, UA LARGE (*)    Squamous Epithelial / LPF 6-30 (*)    All other components within normal limits  CHLAMYDIA/NGC RT PCR ([facility] ONLY)  LIPASE, BLOOD  POC URINE PREG, ED  POCT PREGNANCY, URINE   ____________________________________________  EKG  none ____________________________________________  RADIOLOGY  US Pelvis Transvanginal Non-ob (tv Only)  Result Date: 12/06/2016 CLINICAL  DATA:  Initial evaluation for acute pelvic pain for 2 days, dysuria. EXAM: TRANSABDOMINAL AND TRANSVAGINAL ULTRASOUND OF PELVIS DOPPLER ULTRASOUND OF OVARIES TECHNIQUE: Both transabdominal and transvaginal ultrasound examinations of the pelvis were performed. Transabdominal technique was performed for global imaging of the pelvis including uterus, ovaries, adnexal regions, and pelvic cul-de-sac. It was necessary to proceed with endovaginal exam following the transabdominal exam to visualize the uterus and ovaries. Color and duplex Doppler ultrasound was utilized to evaluate blood flow to the ovaries. COMPARISON:  None. FINDINGS: Uterus Measurements: 8.3 x 4.1 x 5.6 cm. No fibroids or other mass visualized. Endometrium Thickness: 11 mm.  No focal abnormality visualized. Right ovary Measurements: 3.8 x 2.9 x 2.8 cm. Normal appearance/no adnexal mass. Left ovary Measurements: 3.1 x 2.6 x 2.8 cm. Normal appearance/no adnexal mass. 1.1 x 1.0 x 1.1 cm hypoechoic lesion with low-level homogeneous internal echoes noted, most likely a small hemorrhagic cyst. Complex cyst  with internal debris or possibly endometrioma could also be considered. Pulsed Doppler evaluation of both ovaries demonstrates normal low-resistance arterial and venous waveforms. Other findings Trace free fluid within the pelvis, likely physiologic. IMPRESSION: 1. 1.1 cm mildly complex left ovarian cyst, most likely a small hemorrhagic cyst. Complex cyst with internal debris or possibly endometrioma could also be considered. A follow-up ultrasound in 6-12 weeks could be performed to ensure resolution as clinically desired. 2. Otherwise normal pelvic ultrasound. No acute abnormality identified. No evidence for torsion. Electronically Signed   By: Rise Mu M.D.   On: 12/06/2016 01:59   US Pelvis Complete  Result Date: 12/06/2016 CLINICAL DATA:  Initial evaluation for acute pelvic pain for 2 days, dysuria. EXAM: TRANSABDOMINAL AND TRANSVAGINAL  ULTRASOUND OF PELVIS DOPPLER ULTRASOUND OF OVARIES TECHNIQUE: Both transabdominal and transvaginal ultrasound examinations of the pelvis were performed. Transabdominal technique was performed for global imaging of the pelvis including uterus, ovaries, adnexal regions, and pelvic cul-de-sac. It was necessary to proceed with endovaginal exam following the transabdominal exam to visualize the uterus and ovaries. Color and duplex Doppler ultrasound was utilized to evaluate blood flow to the ovaries. COMPARISON:  None. FINDINGS: Uterus Measurements: 8.3 x 4.1 x 5.6 cm. No fibroids or other mass visualized. Endometrium Thickness: 11 mm.  No focal abnormality visualized. Right ovary Measurements: 3.8 x 2.9 x 2.8 cm. Normal appearance/no adnexal mass. Left ovary Measurements: 3.1 x 2.6 x 2.8 cm. Normal appearance/no adnexal mass. 1.1 x 1.0 x 1.1 cm hypoechoic lesion with low-level homogeneous internal echoes noted, most likely a small hemorrhagic cyst. Complex cyst with internal debris or possibly endometrioma could also be considered. Pulsed Doppler evaluation of both ovaries demonstrates normal low-resistance arterial and venous waveforms. Other findings Trace free fluid within the pelvis, likely physiologic. IMPRESSION: 1. 1.1 cm mildly complex left ovarian cyst, most likely a small hemorrhagic cyst. Complex cyst with internal debris or possibly endometrioma could also be considered. A follow-up ultrasound in 6-12 weeks could be performed to ensure resolution as clinically desired. 2. Otherwise normal pelvic ultrasound. No acute abnormality identified. No evidence for torsion. Electronically Signed   By: Rise Mu M.D.   On: 12/06/2016 01:59   US Pelvic Doppler (torsion R/o Or Mass Arterial Flow)  Result Date: 12/06/2016 CLINICAL DATA:  Initial evaluation for acute pelvic pain for 2 days, dysuria. EXAM: TRANSABDOMINAL AND TRANSVAGINAL ULTRASOUND OF PELVIS DOPPLER ULTRASOUND OF OVARIES TECHNIQUE: Both  transabdominal and transvaginal ultrasound examinations of the pelvis were performed. Transabdominal technique was performed for global imaging of the pelvis including uterus, ovaries, adnexal regions, and pelvic cul-de-sac. It was necessary to proceed with endovaginal exam following the transabdominal exam to visualize the uterus and ovaries. Color and duplex Doppler ultrasound was utilized to evaluate blood flow to the ovaries. COMPARISON:  None. FINDINGS: Uterus Measurements: 8.3 x 4.1 x 5.6 cm. No fibroids or other mass visualized. Endometrium Thickness: 11 mm.  No focal abnormality visualized. Right ovary Measurements: 3.8 x 2.9 x 2.8 cm. Normal appearance/no adnexal mass. Left ovary Measurements: 3.1 x 2.6 x 2.8 cm. Normal appearance/no adnexal mass. 1.1 x 1.0 x 1.1 cm hypoechoic lesion with low-level homogeneous internal echoes noted, most likely a small hemorrhagic cyst. Complex cyst with internal debris or possibly endometrioma could also be considered. Pulsed Doppler evaluation of both ovaries demonstrates normal low-resistance arterial and venous waveforms. Other findings Trace free fluid within the pelvis, likely physiologic. IMPRESSION: 1. 1.1 cm mildly complex left ovarian cyst, most likely a small hemorrhagic cyst.  Complex cyst with internal debris or possibly endometrioma could also be considered. A follow-up ultrasound in 6-12 weeks could be performed to ensure resolution as clinically desired. 2. Otherwise normal pelvic ultrasound. No acute abnormality identified. No evidence for torsion. Electronically Signed   By: Rise Mu M.D.   On: 12/06/2016 01:59    ____________________________________________   PROCEDURES  Procedure(s) performed: None  Procedures  Critical Care performed: No  ____________________________________________   INITIAL IMPRESSION / ASSESSMENT AND PLAN / ED COURSE  As part of my medical decision making, I reviewed the following data within the  electronic MEDICAL RECORD NUMBER Notes from prior ED visits   This is a 26 year old female who comes into the hospital today with some pelvic pain. The patient was diagnosed with a urinary tract infection today and prescribed Augmentin. I sent the patient for an ultrasound given the location of her pain.  The patient's differential diagnosis includes urinary tract infection, STI, vaginitis, cervicitis, ovarian cyst.  The patient had some blood work drawn and it was unremarkable. The patient did have a wet prep done and it had many white blood cells with no clue cells Trichomonas or yeast. I sent the patient for an ultrasound and it was found that she had a complex left hemorrhagic ovarian cyst that was 1.1 cm. I did treat the patient with some doxycycline as well as ceftriaxone given her cervicitis. The patient's gonorrhea and chlamydia did return negative but I will still treat her for cervicitis given her tenderness as well as with Keflex for a presumed urinary tract infection. The patient will be discharged to home and encouraged to follow-up with OB/GYN.      ____________________________________________   FINAL CLINICAL IMPRESSION(S) / ED DIAGNOSES  Final diagnoses:  Abdominal pain  Pelvic pain  Cervicitis      NEW MEDICATIONS STARTED DURING THIS VISIT:  New Prescriptions   CEPHALEXIN (KEFLEX) 500 MG CAPSULE    Take 1 capsule (500 mg total) by mouth 3 (three) times daily.   DOXYCYCLINE (VIBRA-TABS) 100 MG TABLET    Take 1 tablet (100 mg total) by mouth 2 (two) times daily.     Note:  This document was prepared using Dragon voice recognition software and may include unintentional dictation errors.    Rebecka Apley, MD 12/06/16 [phone]

## 2016-12-06 NOTE — ED Notes (Signed)
Dr. Webster at the bedside to discuss plan of care.  

## 2016-12-06 NOTE — ED Notes (Signed)
Dr. Zenda Alpers at the bedside to discuss discharge with pt.

## 2016-12-06 NOTE — Discharge Instructions (Signed)
Please follow up with your primary care physician or with OB/GYN for further evaluation of your symptoms

## 2016-12-06 NOTE — ED Notes (Signed)
Pt to ultrasound

## 2018-10-23 IMAGING — US US ART/VEN ABD/PELV/SCROTUM DOPPLER LTD
1 series · 13 of 25 positions shown · non-contrast
Comparison: None.

CLINICAL DATA: Initial evaluation for acute pelvic pain for 2 days,
dysuria.

EXAM:
TRANSABDOMINAL AND TRANSVAGINAL ULTRASOUND OF PELVIS
DOPPLER ULTRASOUND OF OVARIES
TECHNIQUE: Both transabdominal and transvaginal ultrasound examinations of the
pelvis were performed. Transabdominal technique was performed for
global imaging of the pelvis including uterus, ovaries, adnexal
regions, and pelvic cul-de-sac.
It was necessary to proceed with endovaginal exam following the
transabdominal exam to visualize the uterus and ovaries. Color and
duplex Doppler ultrasound was utilized to evaluate blood flow to the
ovaries.

[Series 1: us art/ven abd/pelv/scrotum doppler ltd · 0.24mm/px · 13 of 92 slices shown]
[im 1/92]
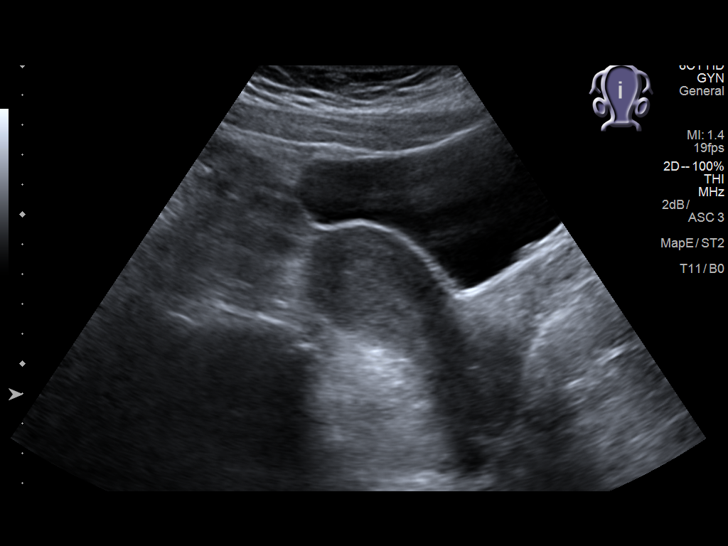
[im 8/92]
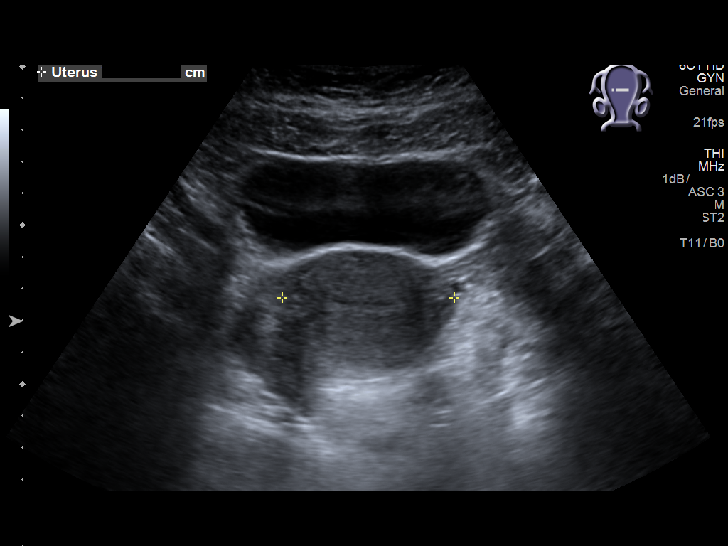
[im 16/92]
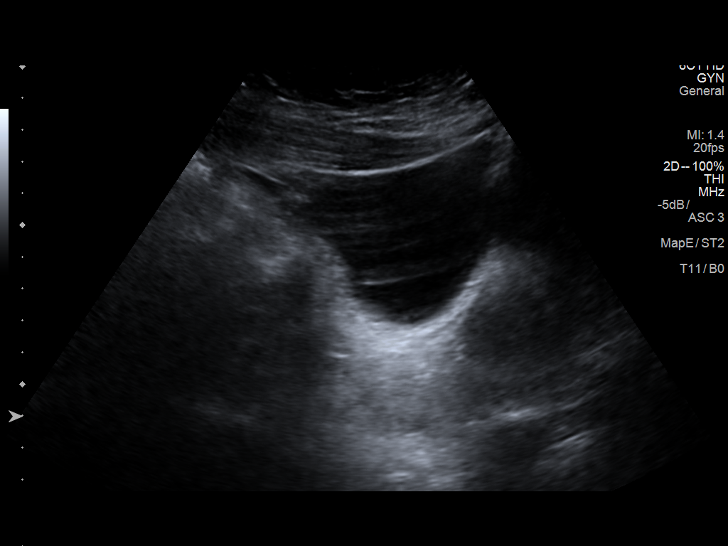
[im 23/92]
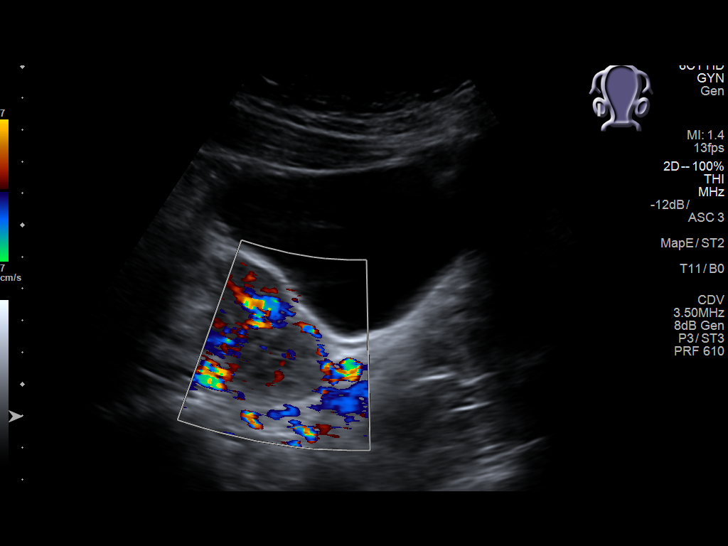
[im 31/92]
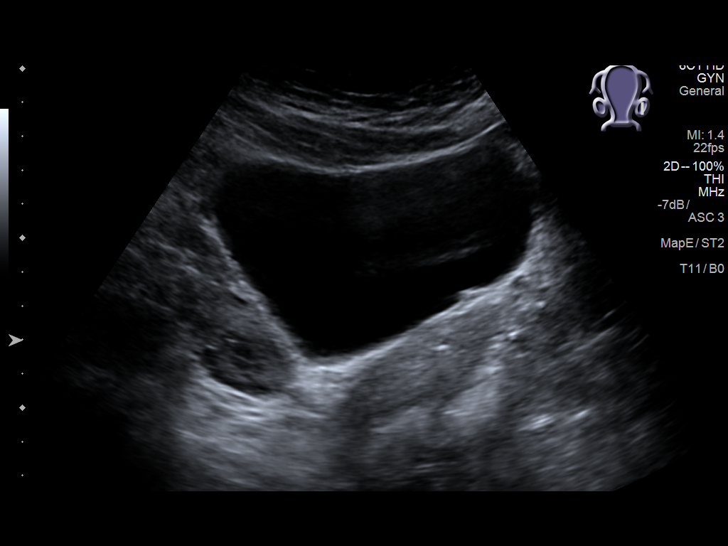
[im 38/92]
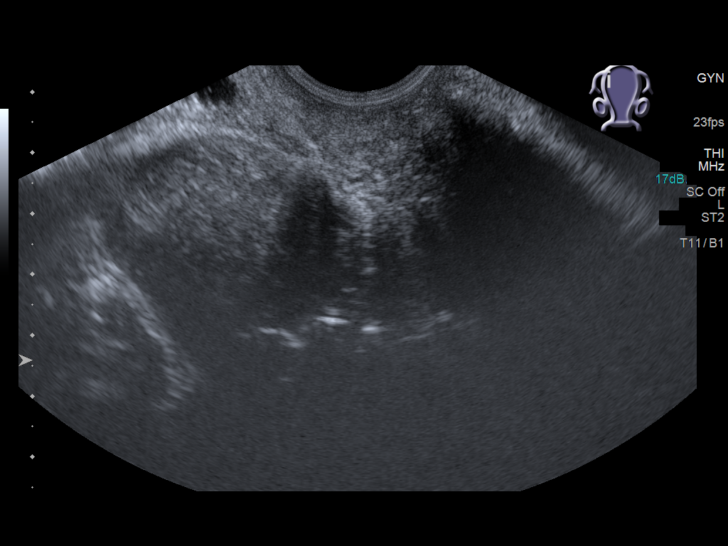
[im 46/92]
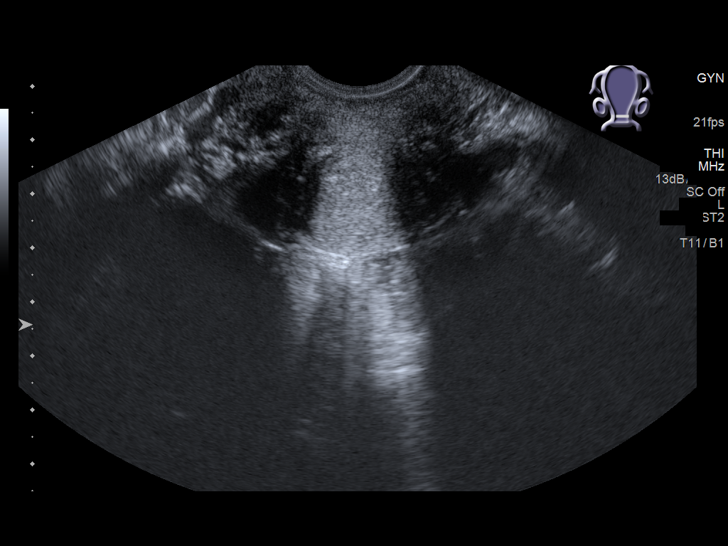
[im 54/92]
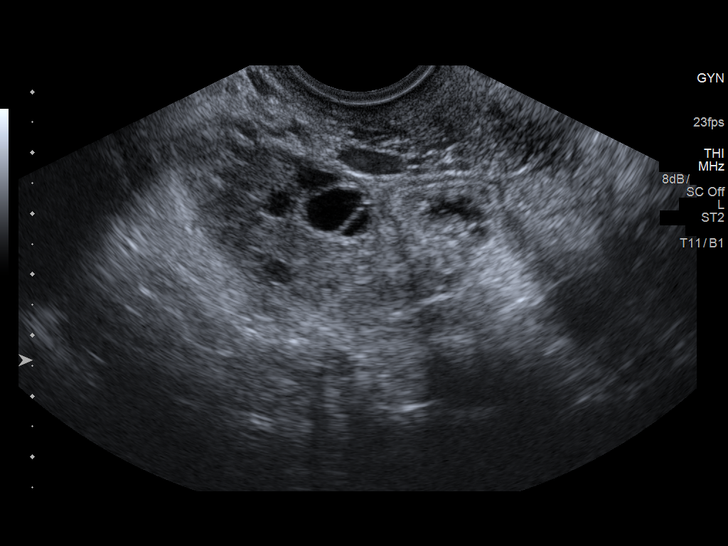
[im 61/92]
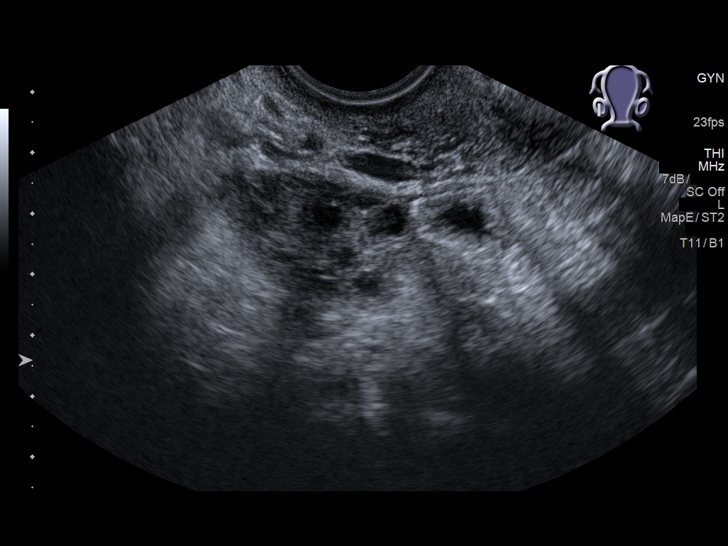
[im 69/92]
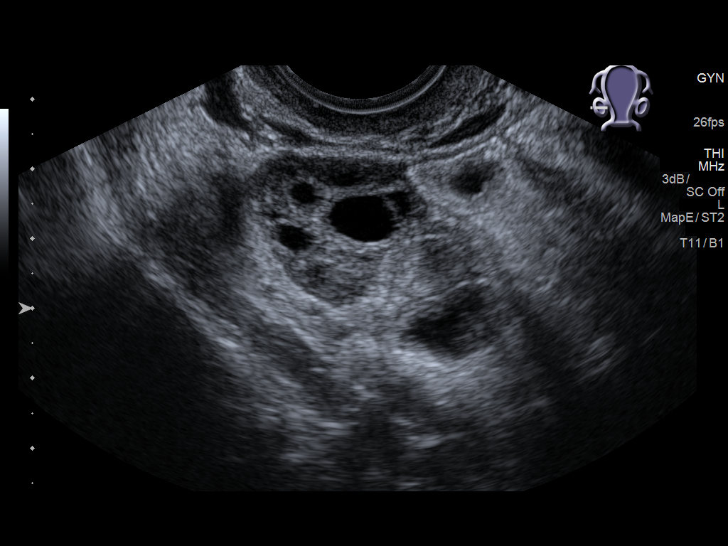
[im 76/92]
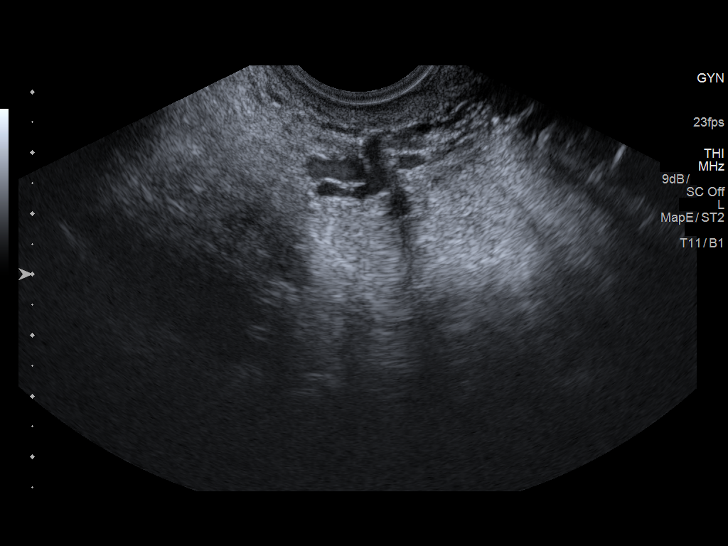
[im 84/92]
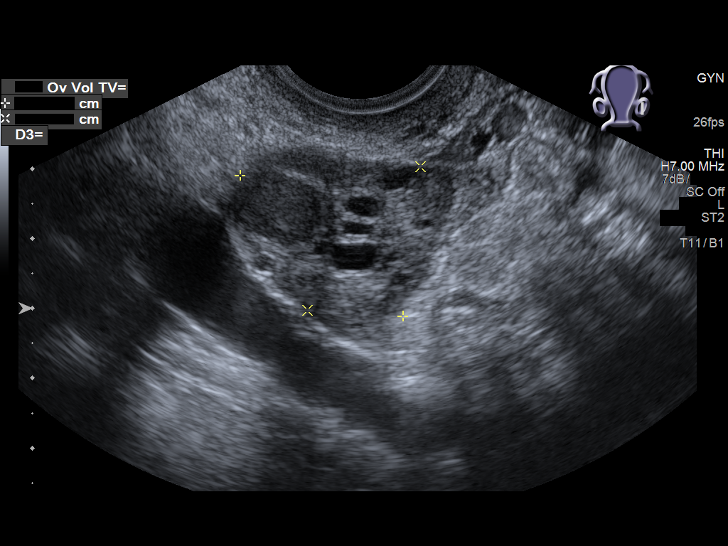
[im 92/92]
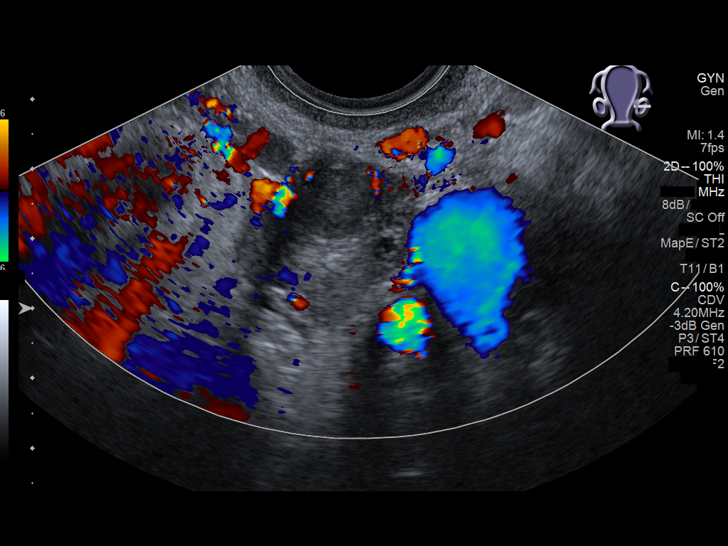

[13 of 25 positions shown; findings below may reference images not displayed]

FINDINGS: Uterus

Measurements: 8.3 x 4.1 x 5.6 cm. No fibroids or other mass
visualized.

Endometrium

Thickness: 11 mm.  No focal abnormality visualized.

Right ovary

Measurements: 3.8 x 2.9 x 2.8 cm. Normal appearance/no adnexal mass.

Left ovary

Measurements: 3.1 x 2.6 x 2.8 cm. Normal appearance/no adnexal mass.
1.1 x 1.0 x 1.1 cm hypoechoic lesion with low-level homogeneous
internal echoes noted, most likely a small hemorrhagic cyst. Complex
cyst with internal debris or possibly endometrioma could also be
considered.

Pulsed Doppler evaluation of both ovaries demonstrates normal
low-resistance arterial and venous waveforms.

Other findings

Trace free fluid within the pelvis, likely physiologic.
IMPRESSION: 1. 1.1 cm mildly complex left ovarian cyst, most likely a small
hemorrhagic cyst. Complex cyst with internal debris or possibly
endometrioma could also be considered. A follow-up ultrasound in
6-12 weeks could be performed to ensure resolution as clinically
desired.
2. Otherwise normal pelvic ultrasound. No acute abnormality
identified. No evidence for torsion.
